# Patient Record
Sex: Female | Born: 1955 | Race: White | Hispanic: No | Smoking: Never smoker
Health system: Southern US, Community
[De-identification: ages and names within clinical notes are randomized; demographics above are authoritative.]

## PROBLEM LIST (undated history)

## (undated) DIAGNOSIS — S129XXA Fracture of neck, unspecified, initial encounter: Secondary | ICD-10-CM

## (undated) DIAGNOSIS — R42 Dizziness and giddiness: Secondary | ICD-10-CM

## (undated) DIAGNOSIS — S42309A Unspecified fracture of shaft of humerus, unspecified arm, initial encounter for closed fracture: Secondary | ICD-10-CM

## (undated) DIAGNOSIS — K649 Unspecified hemorrhoids: Secondary | ICD-10-CM

## (undated) DIAGNOSIS — S27309A Unspecified injury of lung, unspecified, initial encounter: Secondary | ICD-10-CM

## (undated) DIAGNOSIS — IMO0002 Reserved for concepts with insufficient information to code with codable children: Secondary | ICD-10-CM

## (undated) DIAGNOSIS — B029 Zoster without complications: Secondary | ICD-10-CM

## (undated) DIAGNOSIS — Z978 Presence of other specified devices: Secondary | ICD-10-CM

## (undated) HISTORY — PX: TUBAL LIGATION: SHX77

## (undated) HISTORY — PX: FRACTURE SURGERY: SHX138

## (undated) HISTORY — PX: COLONOSCOPY: SHX174

## (undated) HISTORY — PX: KNEE ARTHROSCOPY: SUR90

## (undated) HISTORY — PX: TONSILLECTOMY: SUR1361

---

## 2009-07-29 DIAGNOSIS — IMO0002 Reserved for concepts with insufficient information to code with codable children: Secondary | ICD-10-CM

## 2009-07-29 DIAGNOSIS — Z978 Presence of other specified devices: Secondary | ICD-10-CM

## 2009-07-29 DIAGNOSIS — S129XXA Fracture of neck, unspecified, initial encounter: Secondary | ICD-10-CM

## 2009-07-29 DIAGNOSIS — S27309A Unspecified injury of lung, unspecified, initial encounter: Secondary | ICD-10-CM

## 2009-07-29 DIAGNOSIS — S42309A Unspecified fracture of shaft of humerus, unspecified arm, initial encounter for closed fracture: Secondary | ICD-10-CM

## 2009-07-29 HISTORY — DX: Unspecified injury of lung, unspecified, initial encounter: S27.309A

## 2009-07-29 HISTORY — DX: Reserved for concepts with insufficient information to code with codable children: IMO0002

## 2009-07-29 HISTORY — PX: FRACTURE SURGERY: SHX138

## 2009-07-29 HISTORY — DX: Unspecified fracture of shaft of humerus, unspecified arm, initial encounter for closed fracture: S42.309A

## 2009-07-29 HISTORY — DX: Fracture of neck, unspecified, initial encounter: S12.9XXA

## 2009-07-29 HISTORY — DX: Presence of other specified devices: Z97.8

## 2013-03-16 ENCOUNTER — Other Ambulatory Visit (HOSPITAL_COMMUNITY): Payer: Self-pay | Admitting: Orthopedic Surgery

## 2013-03-18 ENCOUNTER — Encounter (HOSPITAL_COMMUNITY): Payer: Self-pay

## 2013-03-18 ENCOUNTER — Encounter (HOSPITAL_COMMUNITY): Payer: Self-pay | Admitting: Pharmacy Technician

## 2013-03-18 ENCOUNTER — Ambulatory Visit (HOSPITAL_COMMUNITY)
Admission: RE | Admit: 2013-03-18 | Discharge: 2013-03-18 | Disposition: A | Discharge status: Home / Self Care | Payer: BC Managed Care – PPO | Source: Ambulatory Visit | Attending: Orthopedic Surgery | Admitting: Orthopedic Surgery

## 2013-03-18 ENCOUNTER — Encounter (HOSPITAL_COMMUNITY)
Admission: RE | Admit: 2013-03-18 | Discharge: 2013-03-18 | Disposition: A | Discharge status: Home / Self Care | Payer: BC Managed Care – PPO | Source: Ambulatory Visit | Attending: Orthopedic Surgery | Admitting: Orthopedic Surgery

## 2013-03-18 ENCOUNTER — Ambulatory Visit (HOSPITAL_COMMUNITY): Admission: RE | Admit: 2013-03-18 | Payer: BC Managed Care – PPO | Source: Ambulatory Visit

## 2013-03-18 DIAGNOSIS — R091 Pleurisy: Secondary | ICD-10-CM | POA: Insufficient documentation

## 2013-03-18 DIAGNOSIS — Z01810 Encounter for preprocedural cardiovascular examination: Secondary | ICD-10-CM | POA: Insufficient documentation

## 2013-03-18 DIAGNOSIS — Z01818 Encounter for other preprocedural examination: Secondary | ICD-10-CM | POA: Insufficient documentation

## 2013-03-18 DIAGNOSIS — Z01812 Encounter for preprocedural laboratory examination: Secondary | ICD-10-CM | POA: Insufficient documentation

## 2013-03-18 HISTORY — DX: Fracture of neck, unspecified, initial encounter: S12.9XXA

## 2013-03-18 HISTORY — DX: Zoster without complications: B02.9

## 2013-03-18 HISTORY — DX: Unspecified hemorrhoids: K64.9

## 2013-03-18 HISTORY — DX: Unspecified injury of lung, unspecified, initial encounter: S27.309A

## 2013-03-18 HISTORY — DX: Reserved for concepts with insufficient information to code with codable children: IMO0002

## 2013-03-18 HISTORY — DX: Presence of other specified devices: Z97.8

## 2013-03-18 HISTORY — DX: Unspecified fracture of shaft of humerus, unspecified arm, initial encounter for closed fracture: S42.309A

## 2013-03-18 HISTORY — DX: Dizziness and giddiness: R42

## 2013-03-18 LAB — SEDIMENTATION RATE: Sed Rate: 0 mm/hr (ref 0–22)

## 2013-03-18 LAB — PROTIME-INR
INR: 0.97 (ref 0.00–1.49)
Prothrombin Time: 12.7 seconds (ref 11.6–15.2)

## 2013-03-18 LAB — URINALYSIS, ROUTINE W REFLEX MICROSCOPIC
Hgb urine dipstick: NEGATIVE
Nitrite: NEGATIVE
Specific Gravity, Urine: 1.005 (ref 1.005–1.030)
Urobilinogen, UA: 0.2 mg/dL (ref 0.0–1.0)

## 2013-03-18 LAB — COMPREHENSIVE METABOLIC PANEL
Albumin: 4.3 g/dL (ref 3.5–5.2)
Alkaline Phosphatase: 140 U/L — ABNORMAL HIGH (ref 39–117)
BUN: 11 mg/dL (ref 6–23)
Potassium: 3.9 mEq/L (ref 3.5–5.1)
Total Protein: 7.5 g/dL (ref 6.0–8.3)

## 2013-03-18 LAB — CBC
HCT: 42.5 % (ref 36.0–46.0)
MCHC: 35.5 g/dL (ref 30.0–36.0)
Platelets: 235 10*3/uL (ref 150–400)
RDW: 12.6 % (ref 11.5–15.5)

## 2013-03-18 LAB — APTT: aPTT: 30 seconds (ref 24–37)

## 2013-03-18 MED ORDER — CEFAZOLIN SODIUM-DEXTROSE 2-3 GM-% IV SOLR: 2.0000 g | INTRAVENOUS | Status: DC

## 2013-03-18 NOTE — Pre-Procedure Instructions (Signed)
Trenika Hudson  03/18/2013   Your procedure is scheduled on:  March 26, 2013 at 11:59 AM  Report to [facility] at 10:00 AM.  Call this number if you have problems the morning of surgery: [phone]   Remember:   Do not eat food or drink liquids after midnight.   Take these medicines the morning of surgery with A SIP OF WATER: acetaminophen (TYLENOL)   Stop all Vitamins, Fish oil, herbal medications, non-steroidals, Aspirin as of 03/19/2013    Do not wear jewelry, make-up or nail polish.  Do not wear lotions, powders, or perfumes. You may wear deodorant.  Do not shave 48 hours prior to surgery. Men may shave face and neck.  Do not bring valuables to the hospital.  [facility] is not responsible                   for any belongings or valuables.  Contacts, dentures or bridgework may not be worn into surgery.  Leave suitcase in the car. After surgery it may be brought to your room.  For patients admitted to the hospital, checkout time is 11:00 AM the day of  discharge.    Special Instructions: Shower using CHG 2 nights before surgery and the night before surgery.  If you shower the day of surgery use CHG.  Use special wash - you have one bottle of CHG for all showers.  You should use approximately 1/3 of the bottle for each shower.   Please read over the following fact sheets that you were given: Pain Booklet, Coughing and Deep Breathing and Surgical Site Infection Prevention

## 2013-03-19 LAB — C-REACTIVE PROTEIN: CRP: <0.5 mg/dL — ABNORMAL LOW (ref <0.60)

## 2013-03-26 ENCOUNTER — Inpatient Hospital Stay (HOSPITAL_COMMUNITY): Payer: BC Managed Care – PPO | Admitting: Anesthesiology

## 2013-03-26 ENCOUNTER — Encounter (HOSPITAL_COMMUNITY): Payer: Self-pay | Admitting: Anesthesiology

## 2013-03-26 ENCOUNTER — Observation Stay (HOSPITAL_COMMUNITY)
Admission: RE | Admit: 2013-03-26 | Discharge: 2013-03-27 | Disposition: A | Discharge status: Home / Self Care | Payer: BC Managed Care – PPO | Source: Ambulatory Visit | Attending: Orthopedic Surgery | Admitting: Orthopedic Surgery

## 2013-03-26 ENCOUNTER — Encounter (HOSPITAL_COMMUNITY): Payer: Self-pay | Admitting: *Deleted

## 2013-03-26 ENCOUNTER — Encounter (HOSPITAL_COMMUNITY)
Admission: RE | Disposition: A | Discharge status: Home / Self Care | Payer: Self-pay | Source: Ambulatory Visit | Attending: Orthopedic Surgery

## 2013-03-26 DIAGNOSIS — IMO0002 Reserved for concepts with insufficient information to code with codable children: Secondary | ICD-10-CM | POA: Diagnosis present

## 2013-03-26 DIAGNOSIS — S82202N Unspecified fracture of shaft of left tibia, subsequent encounter for open fracture type IIIA, IIIB, or IIIC with nonunion: Secondary | ICD-10-CM

## 2013-03-26 DIAGNOSIS — K649 Unspecified hemorrhoids: Secondary | ICD-10-CM | POA: Insufficient documentation

## 2013-03-26 HISTORY — PX: ORIF TIBIA FRACTURE: SHX5416

## 2013-03-26 LAB — GRAM STAIN: Gram Stain: NONE SEEN

## 2013-03-26 SURGERY — OPEN REDUCTION INTERNAL FIXATION (ORIF) TIBIA FRACTURE
Anesthesia: General | Site: Leg Lower | Laterality: Left | Wound class: Clean

## 2013-03-26 MED ORDER — DEXAMETHASONE SODIUM PHOSPHATE 4 MG/ML IJ SOLN
INTRAMUSCULAR | Status: DC | PRN
  Administered 2013-03-26: 4 mg via INTRAVENOUS

## 2013-03-26 MED ORDER — VANCOMYCIN HCL 1000 MG IV SOLR
INTRAVENOUS | Status: AC
  Filled 2013-03-26: qty 1000

## 2013-03-26 MED ORDER — PROPOFOL 10 MG/ML IV BOLUS
INTRAVENOUS | Status: DC | PRN
  Administered 2013-03-26: 100 mg via INTRAVENOUS

## 2013-03-26 MED ORDER — METOCLOPRAMIDE HCL 5 MG/ML IJ SOLN: 5.0000 mg | Freq: Three times a day (TID) | INTRAMUSCULAR | Status: DC | PRN

## 2013-03-26 MED ORDER — ONDANSETRON HCL 4 MG PO TABS: 4.0000 mg | ORAL_TABLET | Freq: Four times a day (QID) | ORAL | Status: DC | PRN

## 2013-03-26 MED ORDER — HYDROMORPHONE HCL PF 1 MG/ML IJ SOLN
0.2500 mg | INTRAMUSCULAR | Status: DC | PRN
  Administered 2013-03-26 (×4): 0.5 mg via INTRAVENOUS

## 2013-03-26 MED ORDER — SCOPOLAMINE 1 MG/3DAYS TD PT72
1.0000 | MEDICATED_PATCH | TRANSDERMAL | Status: AC
  Administered 2013-03-26: 1 via TRANSDERMAL

## 2013-03-26 MED ORDER — HYDROCODONE-ACETAMINOPHEN 5-325 MG PO TABS
1.0000 | ORAL_TABLET | ORAL | Status: DC | PRN
  Administered 2013-03-27 (×2): 2 via ORAL
  Filled 2013-03-26 (×2): qty 2

## 2013-03-26 MED ORDER — CEFAZOLIN SODIUM 1-5 GM-% IV SOLN
1.0000 g | Freq: Four times a day (QID) | INTRAVENOUS | Status: DC
  Administered 2013-03-26 – 2013-03-27 (×2): 1 g via INTRAVENOUS
  Filled 2013-03-26 (×4): qty 50

## 2013-03-26 MED ORDER — ARTIFICIAL TEARS OP OINT
TOPICAL_OINTMENT | OPHTHALMIC | Status: DC | PRN
  Administered 2013-03-26: 1 via OPHTHALMIC

## 2013-03-26 MED ORDER — MIDAZOLAM HCL 2 MG/2ML IJ SOLN: 0.5000 mg | Freq: Once | INTRAMUSCULAR | Status: DC | PRN

## 2013-03-26 MED ORDER — CEFAZOLIN SODIUM-DEXTROSE 2-3 GM-% IV SOLR: 2.0000 g | INTRAVENOUS | Status: DC

## 2013-03-26 MED ORDER — ONDANSETRON HCL 4 MG/2ML IJ SOLN
INTRAMUSCULAR | Status: DC | PRN
  Administered 2013-03-26: 4 mg via INTRAVENOUS

## 2013-03-26 MED ORDER — OXYCODONE HCL 5 MG PO TABS
5.0000 mg | ORAL_TABLET | Freq: Once | ORAL | Status: AC | PRN
  Administered 2013-03-26: 5 mg via ORAL

## 2013-03-26 MED ORDER — GENTAMICIN SULFATE 40 MG/ML IJ SOLN
INTRAMUSCULAR | Status: DC | PRN
  Administered 2013-03-26: 240 mg via INTRAMUSCULAR

## 2013-03-26 MED ORDER — HYDROMORPHONE HCL PF 1 MG/ML IJ SOLN
INTRAMUSCULAR | Status: AC
  Filled 2013-03-26: qty 1

## 2013-03-26 MED ORDER — FENTANYL CITRATE 0.05 MG/ML IJ SOLN
INTRAMUSCULAR | Status: DC | PRN
  Administered 2013-03-26: 50 ug via INTRAVENOUS
  Administered 2013-03-26: 200 ug via INTRAVENOUS
  Administered 2013-03-26: 50 ug via INTRAVENOUS

## 2013-03-26 MED ORDER — LACTATED RINGERS IV SOLN
INTRAVENOUS | Status: DC | PRN
  Administered 2013-03-26: 12:00:00 via INTRAVENOUS

## 2013-03-26 MED ORDER — OXYCODONE HCL 5 MG PO TABS
ORAL_TABLET | ORAL | Status: AC
  Filled 2013-03-26: qty 1

## 2013-03-26 MED ORDER — PROMETHAZINE HCL 25 MG/ML IJ SOLN: 6.2500 mg | INTRAMUSCULAR | Status: DC | PRN

## 2013-03-26 MED ORDER — OXYCODONE-ACETAMINOPHEN 5-325 MG PO TABS
1.0000 | ORAL_TABLET | ORAL | Status: DC | PRN
  Administered 2013-03-26 (×2): 2 via ORAL
  Filled 2013-03-26 (×2): qty 2

## 2013-03-26 MED ORDER — MEPERIDINE HCL 25 MG/ML IJ SOLN
6.2500 mg | INTRAMUSCULAR | Status: DC | PRN
  Administered 2013-03-26: 12.5 mg via INTRAVENOUS

## 2013-03-26 MED ORDER — ONDANSETRON HCL 4 MG/2ML IJ SOLN: 4.0000 mg | Freq: Four times a day (QID) | INTRAMUSCULAR | Status: DC | PRN

## 2013-03-26 MED ORDER — DOCUSATE SODIUM 100 MG PO CAPS
100.0000 mg | ORAL_CAPSULE | Freq: Two times a day (BID) | ORAL | Status: DC | PRN
  Administered 2013-03-26: 100 mg via ORAL
  Filled 2013-03-26: qty 1

## 2013-03-26 MED ORDER — METOCLOPRAMIDE HCL 10 MG PO TABS: 5.0000 mg | ORAL_TABLET | Freq: Three times a day (TID) | ORAL | Status: DC | PRN

## 2013-03-26 MED ORDER — VANCOMYCIN HCL 1000 MG IV SOLR
INTRAVENOUS | Status: DC | PRN
  Administered 2013-03-26: 1000 mg

## 2013-03-26 MED ORDER — MEPERIDINE HCL 25 MG/ML IJ SOLN
INTRAMUSCULAR | Status: AC
  Filled 2013-03-26: qty 1

## 2013-03-26 MED ORDER — 0.9 % SODIUM CHLORIDE (POUR BTL) OPTIME
TOPICAL | Status: DC | PRN
  Administered 2013-03-26: 1000 mL

## 2013-03-26 MED ORDER — SODIUM CHLORIDE 0.9 % IV SOLN
INTRAVENOUS | Status: DC
  Administered 2013-03-26: 15:00:00 via INTRAVENOUS

## 2013-03-26 MED ORDER — HYDROMORPHONE HCL PF 1 MG/ML IJ SOLN: 0.5000 mg | INTRAMUSCULAR | Status: DC | PRN

## 2013-03-26 MED ORDER — GENTAMICIN SULFATE 40 MG/ML IJ SOLN
INTRAMUSCULAR | Status: AC
  Filled 2013-03-26: qty 6

## 2013-03-26 MED ORDER — CEFAZOLIN SODIUM-DEXTROSE 2-3 GM-% IV SOLR
2.0000 g | Freq: Once | INTRAVENOUS | Status: DC
  Administered 2013-03-26: 2 g via INTRAVENOUS

## 2013-03-26 MED ORDER — LIDOCAINE HCL (CARDIAC) 20 MG/ML IV SOLN
INTRAVENOUS | Status: DC | PRN
  Administered 2013-03-26: 30 mg via INTRAVENOUS

## 2013-03-26 MED ORDER — MIDAZOLAM HCL 5 MG/5ML IJ SOLN
INTRAMUSCULAR | Status: DC | PRN
  Administered 2013-03-26 (×2): 1 mg via INTRAVENOUS

## 2013-03-26 MED ORDER — OXYCODONE-ACETAMINOPHEN 5-325 MG PO TABS: 1.0000 | ORAL_TABLET | ORAL | Status: DC | PRN

## 2013-03-26 MED ORDER — ASPIRIN EC 325 MG PO TBEC
325.0000 mg | DELAYED_RELEASE_TABLET | Freq: Every day | ORAL | Status: DC
  Administered 2013-03-27: 325 mg via ORAL
  Filled 2013-03-26: qty 1

## 2013-03-26 MED ORDER — OXYCODONE HCL 5 MG/5ML PO SOLN: 5.0000 mg | Freq: Once | ORAL | Status: AC | PRN

## 2013-03-26 SURGICAL SUPPLY — 63 items
BANDAGE ESMARK 6X9 LF (GAUZE/BANDAGES/DRESSINGS) IMPLANT
BANDAGE GAUZE ELAST BULKY 4 IN (GAUZE/BANDAGES/DRESSINGS) ×2 IMPLANT
BIT DRILL 2.5X2.75 QC CALB (BIT) ×2 IMPLANT
BIT DRILL CALIBRATED 2.7 (BIT) ×2 IMPLANT
BLADE SAW SGTL 18.5X63.X.64 HD (BLADE) ×2 IMPLANT
BLADE SURG 10 STRL SS (BLADE) ×2 IMPLANT
BNDG COHESIVE 4X5 TAN STRL (GAUZE/BANDAGES/DRESSINGS) ×2 IMPLANT
BNDG COHESIVE 6X5 TAN STRL LF (GAUZE/BANDAGES/DRESSINGS) ×2 IMPLANT
BNDG ESMARK 6X9 LF (GAUZE/BANDAGES/DRESSINGS)
BNDG GAUZE STRTCH 6 (GAUZE/BANDAGES/DRESSINGS) ×2 IMPLANT
CLOTH BEACON ORANGE TIMEOUT ST (SAFETY) ×2 IMPLANT
COVER SURGICAL LIGHT HANDLE (MISCELLANEOUS) ×2 IMPLANT
CUFF TOURNIQUET SINGLE 34IN LL (TOURNIQUET CUFF) IMPLANT
CUFF TOURNIQUET SINGLE 44IN (TOURNIQUET CUFF) IMPLANT
DRAPE C-ARM MINI 42X72 WSTRAPS (DRAPES) ×2 IMPLANT
DRAPE INCISE IOBAN 66X45 STRL (DRAPES) ×2 IMPLANT
DRAPE OEC MINIVIEW 54X84 (DRAPES) ×2 IMPLANT
DRAPE PROXIMA HALF (DRAPES) ×2 IMPLANT
DRAPE U-SHAPE 47X51 STRL (DRAPES) ×2 IMPLANT
DRSG ADAPTIC 3X8 NADH LF (GAUZE/BANDAGES/DRESSINGS) ×2 IMPLANT
DRSG PAD ABDOMINAL 8X10 ST (GAUZE/BANDAGES/DRESSINGS) ×2 IMPLANT
DURAPREP 26ML APPLICATOR (WOUND CARE) ×2 IMPLANT
ELECT REM PT RETURN 9FT ADLT (ELECTROSURGICAL) ×2
ELECTRODE REM PT RTRN 9FT ADLT (ELECTROSURGICAL) ×1 IMPLANT
GLOVE BIOGEL PI IND STRL 9 (GLOVE) ×1 IMPLANT
GLOVE BIOGEL PI INDICATOR 9 (GLOVE) ×1
GLOVE SURG ORTHO 9.0 STRL STRW (GLOVE) ×2 IMPLANT
GOWN PREVENTION PLUS XLARGE (GOWN DISPOSABLE) ×2 IMPLANT
GOWN SRG XL XLNG 56XLVL 4 (GOWN DISPOSABLE) ×2 IMPLANT
GOWN STRL NON-REIN XL XLG LVL4 (GOWN DISPOSABLE) ×2
K-WIRE ACE 1.6X6 (WIRE) ×2
KIT BASIN OR (CUSTOM PROCEDURE TRAY) ×2 IMPLANT
KIT ROOM TURNOVER OR (KITS) ×2 IMPLANT
KIT STIMULAN RAPID CURE  10CC (Orthopedic Implant) ×1 IMPLANT
KIT STIMULAN RAPID CURE 10CC (Orthopedic Implant) ×1 IMPLANT
KWIRE ACE 1.6X6 (WIRE) ×1 IMPLANT
MANIFOLD NEPTUNE II (INSTRUMENTS) ×2 IMPLANT
NS IRRIG 1000ML POUR BTL (IV SOLUTION) ×2 IMPLANT
PACK ORTHO EXTREMITY (CUSTOM PROCEDURE TRAY) ×2 IMPLANT
PAD ARMBOARD 7.5X6 YLW CONV (MISCELLANEOUS) ×4 IMPLANT
PADDING CAST COTTON 6X4 STRL (CAST SUPPLIES) ×2 IMPLANT
PLATE 9H LT DIST ANTLAT TIB (Plate) ×1 IMPLANT
PLATE ANTLAT CNTR 156X9 (Plate) ×1 IMPLANT
SCREW CORTICAL 3.5MM  20MM (Screw) ×1 IMPLANT
SCREW CORTICAL 3.5MM  28MM (Screw) ×1 IMPLANT
SCREW CORTICAL 3.5MM 20MM (Screw) ×1 IMPLANT
SCREW CORTICAL 3.5MM 24MM (Screw) ×8 IMPLANT
SCREW CORTICAL 3.5MM 28MM (Screw) ×1 IMPLANT
SCREW LOCK CORT STAR 3.5X32 (Screw) ×2 IMPLANT
SCREW LOCK CORT STAR 3.5X36 (Screw) ×2 IMPLANT
SCREW LOCK CORT STAR 3.5X38 (Screw) ×2 IMPLANT
SPONGE GAUZE 4X4 12PLY (GAUZE/BANDAGES/DRESSINGS) ×2 IMPLANT
SPONGE LAP 18X18 X RAY DECT (DISPOSABLE) ×2 IMPLANT
STAPLER VISISTAT 35W (STAPLE) IMPLANT
SUCTION FRAZIER TIP 10 FR DISP (SUCTIONS) ×2 IMPLANT
SUT ETHILON 2 0 PSLX (SUTURE) ×4 IMPLANT
SUT VIC AB 2-0 CTB1 (SUTURE) ×4 IMPLANT
SWAB COLLECTION DEVICE MRSA (MISCELLANEOUS) ×2 IMPLANT
TOWEL OR 17X24 6PK STRL BLUE (TOWEL DISPOSABLE) ×2 IMPLANT
TOWEL OR 17X26 10 PK STRL BLUE (TOWEL DISPOSABLE) ×2 IMPLANT
TUBE ANAEROBIC SPECIMEN COL (MISCELLANEOUS) ×2 IMPLANT
TUBE CONNECTING 12X1/4 (SUCTIONS) ×2 IMPLANT
WATER STERILE IRR 1000ML POUR (IV SOLUTION) IMPLANT

## 2013-03-26 NOTE — Anesthesia Postprocedure Evaluation (Signed)
  Anesthesia Post-op Note  Patient: Courtney Vance  Procedure(s) Performed: Procedure(s) with comments: OPEN REDUCTION INTERNAL FIXATION (ORIF) TIBIA FRACTURE- left (Left) - Open Reduction Internal Fixation Left Tibia, Antibiotic Beads  Patient Location: PACU  Anesthesia Type:General  Level of Consciousness: awake and alert   Airway and Oxygen Therapy: Patient Spontanous Breathing  Post-op Pain: mild  Post-op Assessment: Post-op Vital signs reviewed, Patient's Cardiovascular Status Stable, Respiratory Function Stable, Patent Airway, No signs of Nausea or vomiting and Pain level controlled  Post-op Vital Signs: stable  Complications: No apparent anesthesia complications

## 2013-03-26 NOTE — Discharge Instructions (Signed)
Nonweightbearing left foot. Where the fracture boot for ambulation. Work on range of motion of the ankle. Keep dressing clean dry and intact until followup.

## 2013-03-26 NOTE — Op Note (Signed)
OPERATIVE REPORT  DATE OF SURGERY: 03/26/2013  PATIENT:  Courtney Vance,  57 y.o. female  PRE-OPERATIVE DIAGNOSIS:  Non-Union Left Distal Tibia  POST-OPERATIVE DIAGNOSIS:  Non-Union Left Distal Tibia  PROCEDURE:  Procedure(s): OPEN REDUCTION INTERNAL FIXATION (ORIF) TIBIA FRACTURE- left Excision muscle soft tissue and bone. Placement of antibiotic beads with 10 cc stimulant beads with 1 g vancomycin and 240 mg gentamicin  SURGEON:  Surgeon(s): Nadara Mustard, MD  ANESTHESIA:   general  EBL:  Minimal ML  SPECIMEN:  Source of Specimen:  Cultures obtained x2000 from the nonunion site sent for quantitative cultures.  TOURNIQUET:  * No tourniquets in log *  PROCEDURE DETAILS: Patient is a 57 year old woman who is 3 years status post open type IIIa shaft tibia and fibular fracture on the left. Patient is undergone multiple surgical interventions in both floor to in [place] she has persistent nonunion of the tibia and presents at this time for surgical intervention. Risks and benefits were discussed including potential for deep infection potential for a two-stage operation risk of recurrent infection neurovascular injury nonhealing of the bone nonhealing of the skin need for additional surgery. Patient states she understands and wished to proceed at this time. Description of procedure patient brought to the operating room and underwent a general anesthetic. Patient did not want a regional block. After adequate levels and anesthesia obtained patient's left lower extremity was prepped using DuraPrep and draped into a sterile field. A extensile incision was made anterior laterally. This was carried bluntly down to bone and the compartments were elevated off the bone. Patient a large fibrous nonunion. Using a reciprocating saw osteotomes and curettes the nonviable bone was excised the fibrous bone was excised this was debrided back to healthy bleeding bone. The wound was irrigated with pulsatile  lavage throughout the case. The fracture was reduced and stabilized with a locking anterior lateral plate locking distally and compression screws proximally. C-arm fluoroscopy verified reduction. The incision was closed using 2-0 nylon. The wound was covered with Adaptic orthopedic sponges AB dressing Kerlix and Coban. Patient was extubated taken to the PACU in stable condition.  PLAN OF CARE: Admit to inpatient   PATIENT DISPOSITION:  PACU - hemodynamically stable.   Nadara Mustard, MD 03/26/2013 2:08 PM
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
## 2013-03-26 NOTE — Anesthesia Procedure Notes (Signed)
Procedure Name: LMA Insertion Date/Time: 03/26/2013 12:32 PM Performed by: Gayla Medicus Pre-anesthesia Checklist: Patient identified, Timeout performed, Emergency Drugs available, Suction available and Patient being monitored Patient Re-evaluated:Patient Re-evaluated prior to inductionOxygen Delivery Method: Circle system utilized Preoxygenation: Pre-oxygenation with 100% oxygen Intubation Type: IV induction LMA: LMA inserted LMA Size: 4.0 Number of attempts: 1 Placement Confirmation: positive ETCO2 and breath sounds checked- equal and bilateral Tube secured with: Tape Dental Injury: Teeth and Oropharynx as per pre-operative assessment

## 2013-03-26 NOTE — Progress Notes (Signed)
Patient ID: Courtney Vance, female   DOB: [birth year], 58 y.o.   MRN: [id number] Postoperative check status post open reduction internal fixation nonunion left tib-fib fracture 3 years out from her initial surgery. Patient is comfortable she has good range of motion of the foot and ankle. Her rotation of the ankle has been reestablished.  Patient may be discharged to home this weekend nonweightbearing on the left per prescription for Percocet on the chart I will followup with her in 2 weeks. She does have a followup appointment.

## 2013-03-26 NOTE — Preoperative (Signed)
Beta Blockers   Reason not to administer Beta Blockers:Not Applicable 

## 2013-03-26 NOTE — Anesthesia Preprocedure Evaluation (Addendum)
Anesthesia Evaluation  Patient identified by MRN, date of birth, ID band Patient awake    Reviewed: Allergy & Precautions, H&P , NPO status , Patient's Chart, lab work & pertinent test results  History of Anesthesia Complications Negative for: history of anesthetic complications  Airway Mallampati: II TM Distance: >3 FB Neck ROM: Full    Dental  (+) Teeth Intact and Dental Advisory Given   Pulmonary neg pulmonary ROS,  breath sounds clear to auscultation  Pulmonary exam normal       Cardiovascular negative cardio ROS  Rhythm:Regular Rate:Normal     Neuro/Psych vertigo negative psych ROS   GI/Hepatic negative GI ROS, Neg liver ROS,   Endo/Other  negative endocrine ROS  Renal/GU negative Renal ROS     Musculoskeletal   Abdominal   Peds  Hematology negative hematology ROS (+)   Anesthesia Other Findings   Reproductive/Obstetrics                          Anesthesia Physical Anesthesia Plan  ASA: I  Anesthesia Plan: General   Post-op Pain Management:    Induction: Intravenous  Airway Management Planned: LMA  Additional Equipment:   Intra-op Plan:   Post-operative Plan:   Informed Consent: I have reviewed the patients History and Physical, chart, labs and discussed the procedure including the risks, benefits and alternatives for the proposed anesthesia with the patient or authorized representative who has indicated his/her understanding and acceptance.   Dental advisory given  Plan Discussed with: CRNA, Anesthesiologist and Surgeon  Anesthesia Plan Comments: (Plan routine monitors, GA- LMA OK Patient declines popliteal block for analgesia)       Anesthesia Quick Evaluation

## 2013-03-26 NOTE — Transfer of Care (Signed)
Immediate Anesthesia Transfer of Care Note  Patient: Courtney Vance  Procedure(s) Performed: Procedure(s) with comments: OPEN REDUCTION INTERNAL FIXATION (ORIF) TIBIA FRACTURE- left (Left) - Open Reduction Internal Fixation Left Tibia, Antibiotic Beads  Patient Location: PACU  Anesthesia Type:General  Level of Consciousness: awake, alert  and oriented  Airway & Oxygen Therapy: Patient Spontanous Breathing and Patient connected to nasal cannula oxygen  Post-op Assessment: Report given to PACU RN, Post -op Vital signs reviewed and stable and Patient moving all extremities X 4  Post vital signs: Reviewed and stable  Complications: No apparent anesthesia complications

## 2013-03-26 NOTE — Progress Notes (Signed)
Orthopedic Tech Progress Note Patient Details:  Courtney Vance January 20, 1956 [id number]  Ortho Devices Type of Ortho Device: Postop shoe/boot Ortho Device/Splint Location: LLE Ortho Device/Splint Interventions: Ordered;Application   Jennye Moccasin 03/26/2013, 4:32 PM

## 2013-03-27 DIAGNOSIS — IMO0002 Reserved for concepts with insufficient information to code with codable children: Secondary | ICD-10-CM | POA: Diagnosis not present

## 2013-03-27 MED ORDER — OXYCODONE-ACETAMINOPHEN 5-325 MG PO TABS: 1.0000 | ORAL_TABLET | ORAL | Status: DC | PRN
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
## 2013-03-27 NOTE — Progress Notes (Signed)
   CARE MANAGEMENT NOTE 03/27/2013  Patient:  Courtney Vance,Courtney Vance   Account Number:  [id number]  Date Initiated:  03/27/2013  Documentation initiated by:  [facility]  Subjective/Objective Assessment:   OPEN REDUCTION INTERNAL FIXATION (ORIF) TIBIA FRACTURE- left     Action/Plan:   no PT/OT recommended   Anticipated DC Date:  03/27/2013   Anticipated DC Plan:  HOME W HOME HEALTH SERVICES      DC Planning Services  CM consult      Choice offered to / List presented to:             Status of service:  Completed, signed off Medicare Important Message given?   (If response is "NO", the following Medicare IM given date fields will be blank) Date Medicare IM given:   Date Additional Medicare IM given:    Discharge Disposition:  HOME/SELF CARE  Per UR Regulation:    If discussed at Long Length of Stay Meetings, dates discussed:    Comments:

## 2013-03-27 NOTE — Progress Notes (Signed)
1240  Discharge instruction and prescription given to pt >Verbalized understanding Wheeled to lobby by NT  With spouse

## 2013-03-27 NOTE — Discharge Summary (Signed)
  Discharge to home in stable condition. 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 Surgical procedure open reduction internal fixation left tibia takedown nonunion.

## 2013-03-27 NOTE — Progress Notes (Signed)
UR Completed.  Stephnie Parlier Jane [phone] 03/27/2013  

## 2013-03-28 LAB — WOUND CULTURE

## 2013-03-30 ENCOUNTER — Encounter (HOSPITAL_COMMUNITY): Payer: Self-pay | Admitting: Orthopedic Surgery

## 2013-03-30 LAB — TISSUE CULTURE: Gram Stain: NONE SEEN

## 2013-03-31 LAB — ANAEROBIC CULTURE

## 2014-03-14 IMAGING — CR DG CHEST 2V
2 series · 2 of 2 positions shown · non-contrast
Comparison: None.

CLINICAL DATA: Preop lower leg surgery.

EXAM:
CHEST  2 VIEW

[w chest pa]
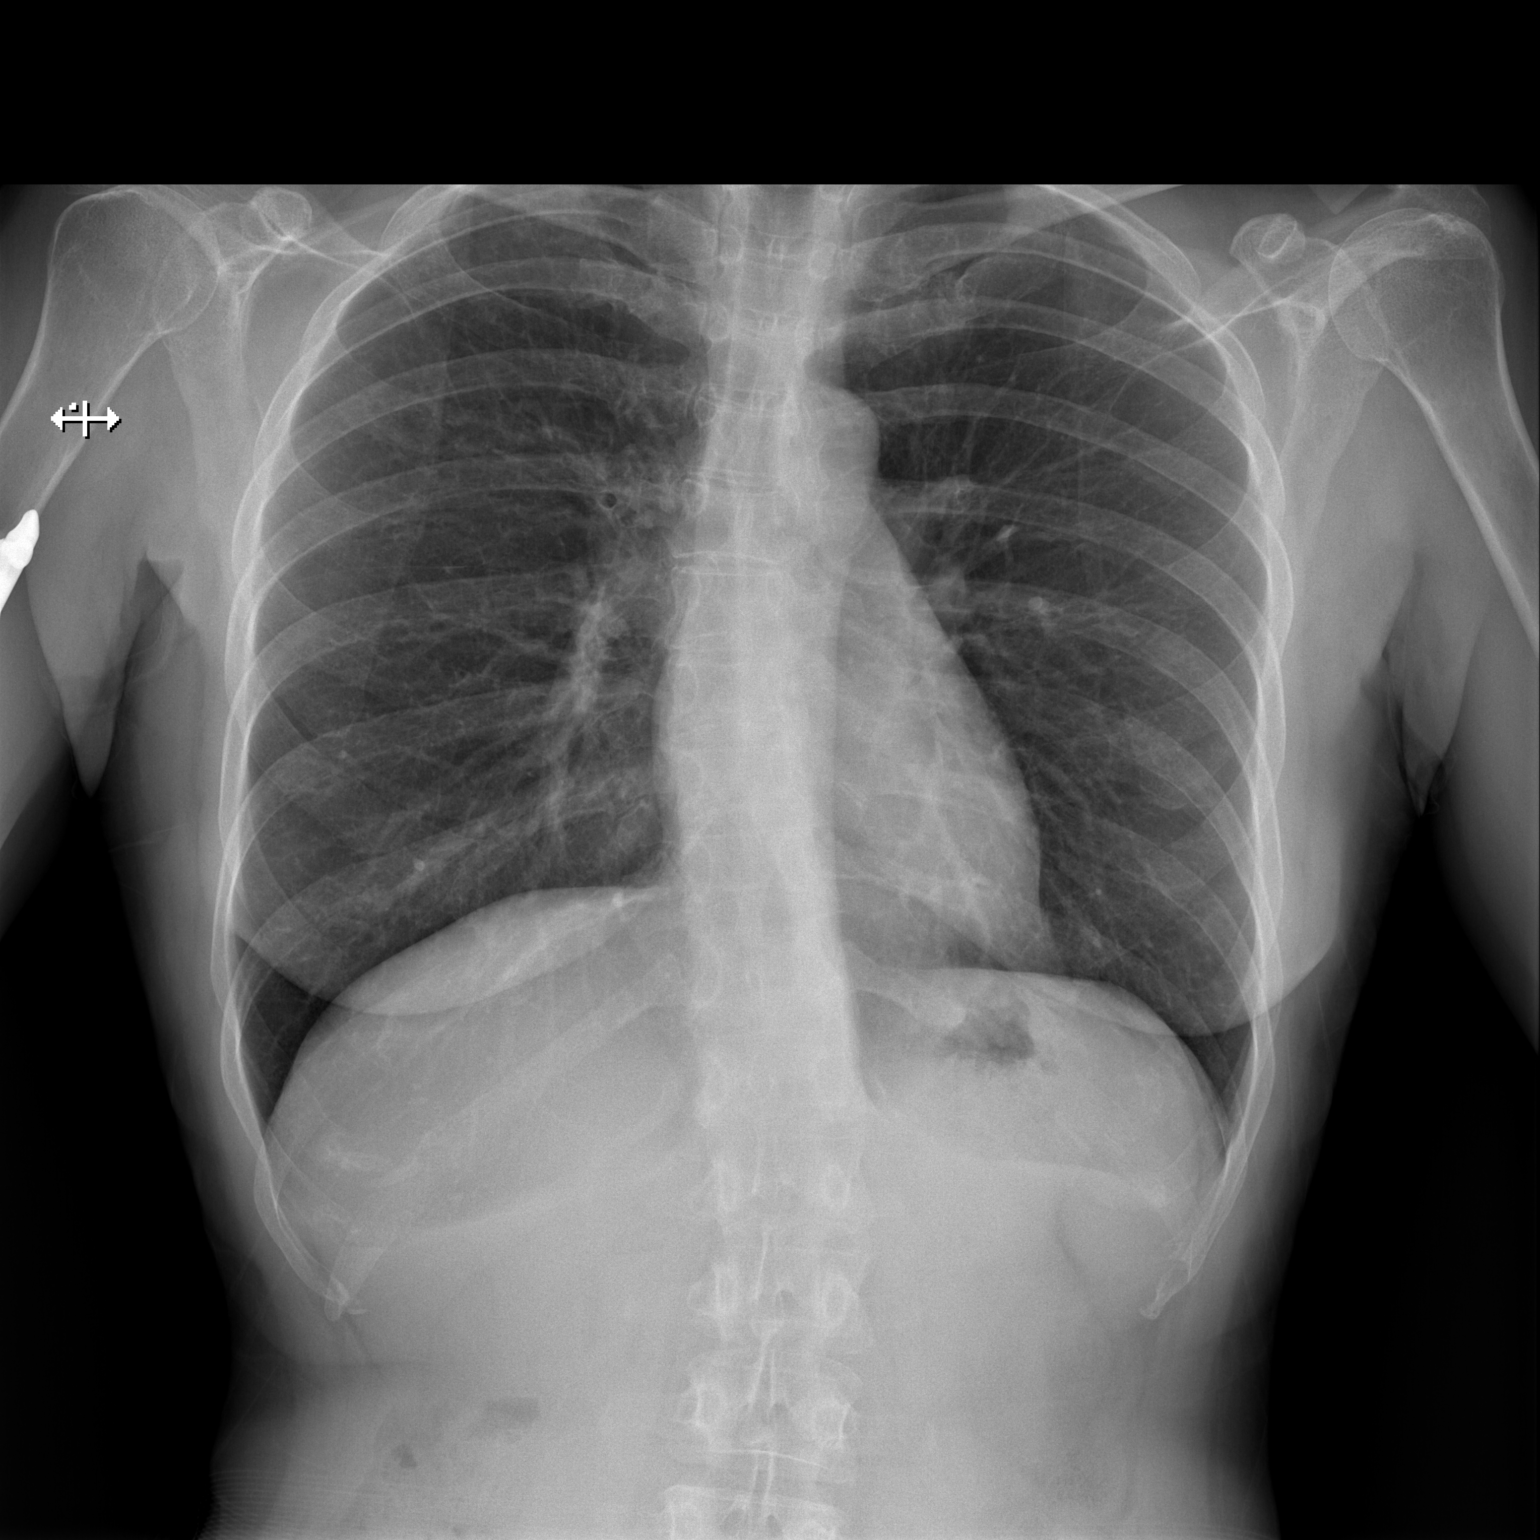

[w chest lat]
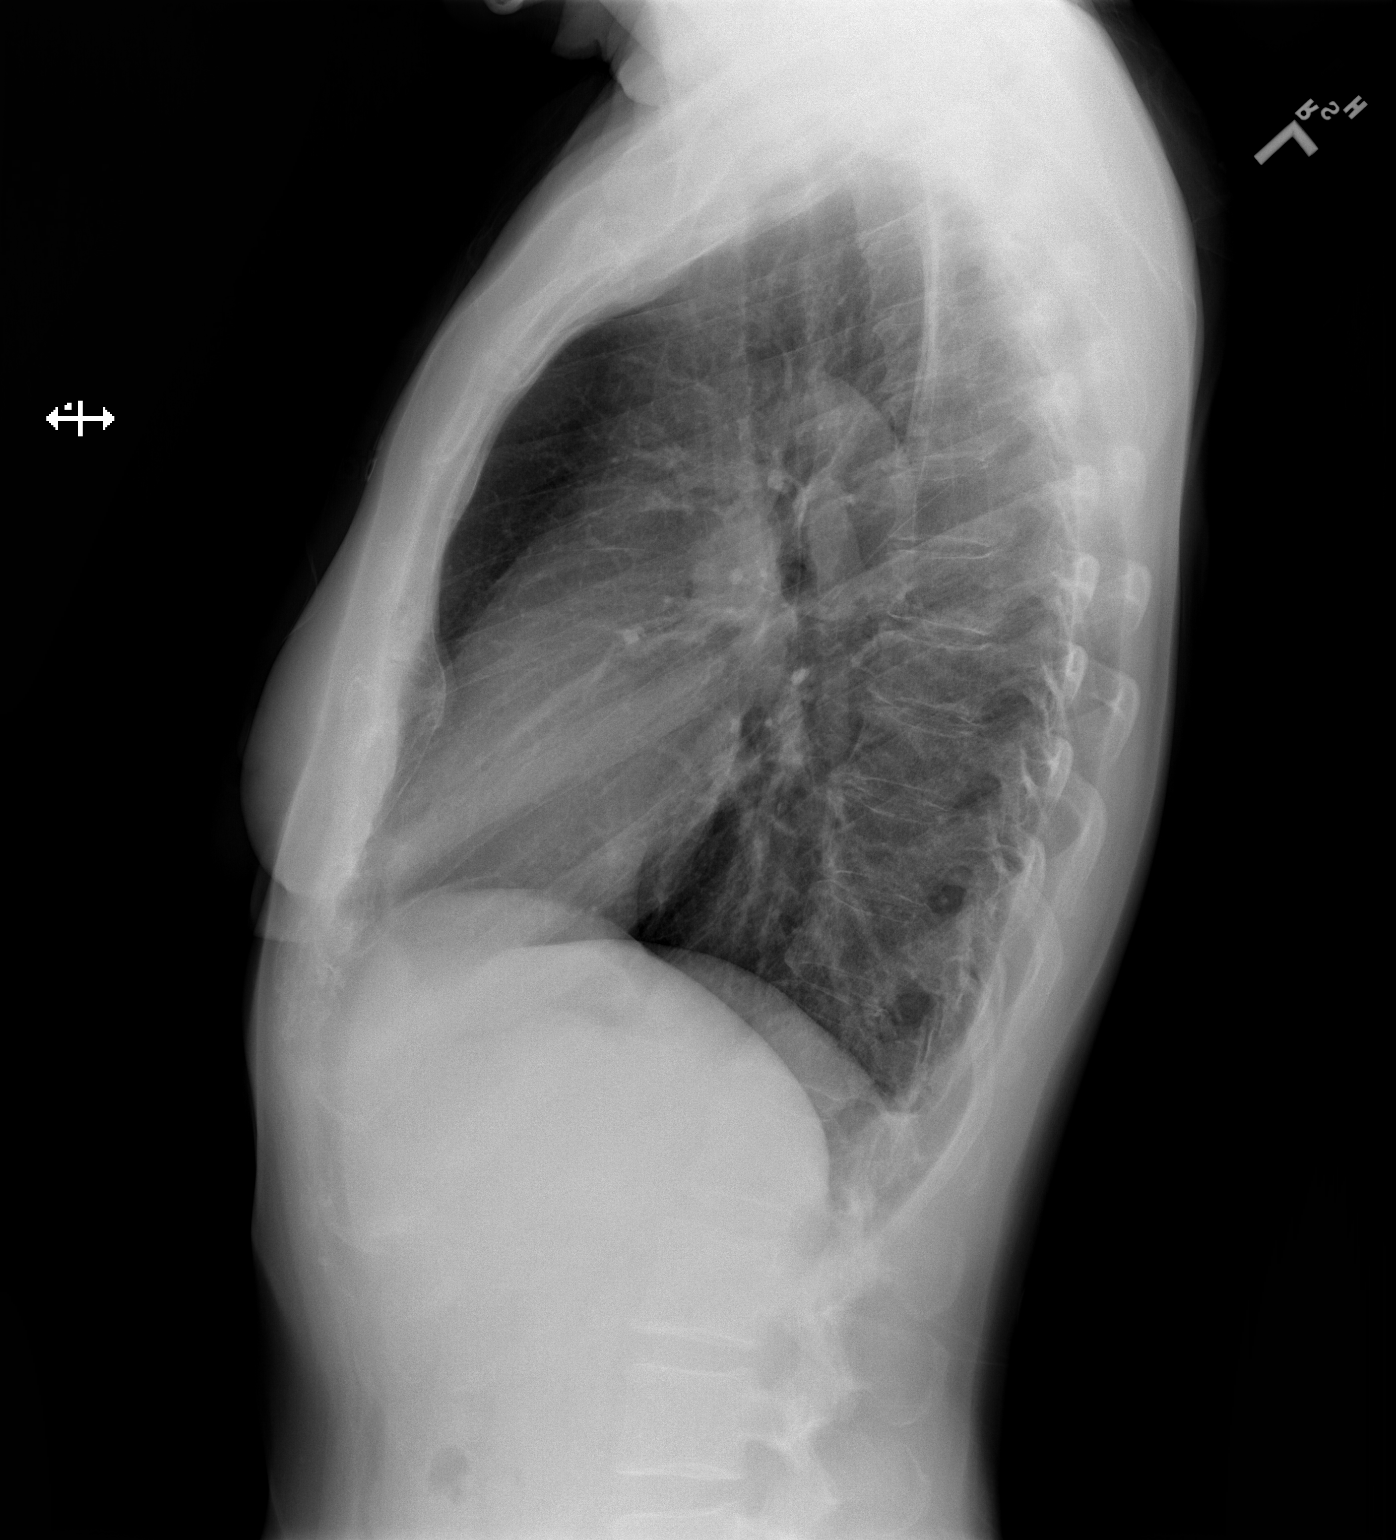

[2 of 2 positions shown; findings below may reference images not displayed]

FINDINGS: Mild peribronchial thickening. Heart and mediastinal contours are
within normal limits. No focal opacities or effusions. No acute bony
abnormality.
IMPRESSION: Mild bronchitic changes.

## 2023-02-18 ENCOUNTER — Other Ambulatory Visit (INDEPENDENT_AMBULATORY_CARE_PROVIDER_SITE_OTHER): Payer: Medicare Other

## 2023-02-18 ENCOUNTER — Ambulatory Visit (INDEPENDENT_AMBULATORY_CARE_PROVIDER_SITE_OTHER): Payer: Medicare Other | Admitting: Orthopedic Surgery

## 2023-02-18 DIAGNOSIS — M25561 Pain in right knee: Secondary | ICD-10-CM

## 2023-02-19 ENCOUNTER — Encounter: Payer: Self-pay | Admitting: Orthopedic Surgery

## 2023-02-19 DIAGNOSIS — M25561 Pain in right knee: Secondary | ICD-10-CM

## 2023-02-19 MED ORDER — METHYLPREDNISOLONE ACETATE 40 MG/ML IJ SUSP
40.0000 mg | INTRAMUSCULAR | Status: AC | PRN
  Administered 2023-02-19: 40 mg via INTRA_ARTICULAR

## 2023-02-19 MED ORDER — LIDOCAINE HCL (PF) 1 % IJ SOLN
5.0000 mL | INTRAMUSCULAR | Status: AC | PRN
  Administered 2023-02-19: 5 mL
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
## 2023-02-27 ENCOUNTER — Telehealth: Payer: Self-pay | Admitting: Orthopedic Surgery

## 2023-02-27 NOTE — Telephone Encounter (Signed)
Patient called advised the injection in her right knee did not work. Patient asked if she can get set up for an MRI? The number to contact patient is [phone]

## 2023-02-27 NOTE — Telephone Encounter (Signed)
Pt was in the office last Tuesday and states that cortisone injection in knee dis not help and wants to proceed with MRI. Ok to order?

## 2023-02-28 ENCOUNTER — Other Ambulatory Visit: Payer: Self-pay

## 2023-02-28 DIAGNOSIS — M25561 Pain in right knee: Secondary | ICD-10-CM

## 2023-02-28 NOTE — Telephone Encounter (Signed)
I called pt and lm on vm to advise order for MRI in chart. We will call to get auth from insurance and then call to sch once this has been obtained. Can take 5-7 business days to get auth  and to call with any questions.

## 2023-03-10 ENCOUNTER — Encounter: Payer: Self-pay | Admitting: Orthopedic Surgery

## 2023-03-16 ENCOUNTER — Ambulatory Visit
Admission: RE | Admit: 2023-03-16 | Discharge: 2023-03-16 | Disposition: A | Discharge status: Home / Self Care | Payer: Medicare Other | Source: Ambulatory Visit | Attending: Orthopedic Surgery | Admitting: Orthopedic Surgery

## 2023-03-16 DIAGNOSIS — M25561 Pain in right knee: Secondary | ICD-10-CM

## 2023-03-18 ENCOUNTER — Encounter: Payer: Self-pay | Admitting: Orthopedic Surgery

## 2023-03-19 ENCOUNTER — Telehealth: Payer: Self-pay

## 2023-03-19 NOTE — Telephone Encounter (Signed)
Pt would like to proceed with surgery if you will please complete as blue sheet. Courtney Vance she would like to sch in October. I advised that you would call her in a few weeks to set this up.

## 2023-05-07 ENCOUNTER — Encounter (HOSPITAL_COMMUNITY): Payer: Self-pay | Admitting: Orthopedic Surgery

## 2023-05-07 ENCOUNTER — Other Ambulatory Visit: Payer: Self-pay

## 2023-05-07 NOTE — Progress Notes (Signed)
PCP - Quitman Livings Cardiologist -   PPM/ICD - denies Device Orders - n/a Rep Notified - n/a  Chest x-ray - denies  EKG - denies Stress Test - denies ECHO - denies Cardiac Cath - denies  CPAP - denies  DM denies  Blood Thinner Instructions: denies Aspirin Instructions: n/a  ERAS Protcol - Clear liquids until 5;30 am  COVID TEST- na  Anesthesia review: no  Patient verbally denies any shortness of breath, fever, cough and chest pain during phone call   -------------  SDW INSTRUCTIONS given:  Your procedure is scheduled on May 09, 2023.  Report to [facility] Main Entrance "A" at 6:00 A.M., and check in at the Admitting office.  Call this number if you have problems the morning of surgery:  [phone]   Remember:  Do not eat after midnight the night before your surgery  You may drink clear liquids until 5:30 the morning of your surgery.   Clear liquids allowed are: Water, Non-Citrus Juices (without pulp), Carbonated Beverages, Clear Tea, Black Coffee Only, and Gatorade    Take these medicines the morning of surgery with A SIP OF WATER   IF NEEDED acetaminophen (TYLENOL)   As of today, STOP taking any Aspirin (unless otherwise instructed by your surgeon) Aleve, Naproxen, Ibuprofen, Motrin, Advil, Goody's, BC's, all herbal medications, fish oil, and all vitamins.                      Do not wear jewelry, make up, or nail polish            Do not wear lotions, powders, perfumes/colognes, or deodorant.            Do not shave 48 hours prior to surgery.  Men may shave face and neck.            Do not bring valuables to the hospital.            [facility] is not responsible for any belongings or valuables.  Do NOT Smoke (Tobacco/Vaping) 24 hours prior to your procedure If you use a CPAP at night, you may bring all equipment for your overnight stay.   Contacts, glasses, dentures or bridgework may not be worn into surgery.      For patients admitted to the  hospital, discharge time will be determined by your treatment team.   Patients discharged the day of surgery will not be allowed to drive home, and someone needs to stay with them for 24 hours.    Special instructions:   [facility]- Preparing For Surgery  Before surgery, you can play an important role. Because skin is not sterile, your skin needs to be as free of germs as possible. You can reduce the number of germs on your skin by washing with CHG (chlorahexidine gluconate) Soap before surgery.  CHG is an antiseptic cleaner which kills germs and bonds with the skin to continue killing germs even after washing.    Oral Hygiene is also important to reduce your risk of infection.  Remember - BRUSH YOUR TEETH THE MORNING OF SURGERY WITH YOUR REGULAR TOOTHPASTE  Please do not use if you have an allergy to CHG or antibacterial soaps. If your skin becomes reddened/irritated stop using the CHG.  Do not shave (including legs and underarms) for at least 48 hours prior to first CHG shower. It is OK to shave your face.  Please follow these instructions carefully.   Shower the [employer] BEFORE SURGERY  and the MORNING OF SURGERY with DIAL Soap.   Pat yourself dry with a CLEAN TOWEL.  Wear CLEAN PAJAMAS to bed the night before surgery  Place CLEAN SHEETS on your bed the night of your first shower and DO NOT SLEEP WITH PETS.   Day of Surgery: Please shower morning of surgery  Wear Clean/Comfortable clothing the morning of surgery Do not apply any deodorants/lotions.   Remember to brush your teeth WITH YOUR REGULAR TOOTHPASTE.   Questions were answered. Patient verbalized understanding of instructions.

## 2023-05-08 NOTE — Anesthesia Preprocedure Evaluation (Addendum)
Anesthesia Evaluation  Patient identified by MRN, date of birth, ID band Patient awake    Reviewed: Allergy & Precautions, H&P , NPO status , Patient's Chart, lab work & pertinent test results  History of Anesthesia Complications Negative for: history of anesthetic complications  Airway Mallampati: II  TM Distance: >3 FB Neck ROM: Full    Dental  (+) Teeth Intact, Dental Advisory Given   Pulmonary neg pulmonary ROS   Pulmonary exam normal breath sounds clear to auscultation       Cardiovascular Exercise Tolerance: Good negative cardio ROS Normal cardiovascular exam Rhythm:Regular Rate:Normal     Neuro/Psych vertigo negative neurological ROS  negative psych ROS   GI/Hepatic negative GI ROS, Neg liver ROS,,,  Endo/Other  negative endocrine ROS    Renal/GU negative Renal ROS  negative genitourinary   Musculoskeletal   Abdominal   Peds  Hematology negative hematology ROS (+)   Anesthesia Other Findings   Reproductive/Obstetrics negative OB ROS                             Anesthesia Physical Anesthesia Plan  ASA: 3  Anesthesia Plan: General   Post-op Pain Management: Minimal or no pain anticipated, Tylenol PO (pre-op)* and Celebrex PO (pre-op)*   Induction: Intravenous  PONV Risk Score and Plan: 3 and Ondansetron, Treatment may vary due to age or medical condition, Scopolamine patch - Pre-op and TIVA  Airway Management Planned: LMA  Additional Equipment: None  Intra-op Plan:   Post-operative Plan: Extubation in OR  Informed Consent: I have reviewed the patients History and Physical, chart, labs and discussed the procedure including the risks, benefits and alternatives for the proposed anesthesia with the patient or authorized representative who has indicated his/her understanding and acceptance.     Dental advisory given  Plan Discussed with: CRNA, Anesthesiologist and  Surgeon  Anesthesia Plan Comments:         Anesthesia Quick Evaluation

## 2023-05-09 ENCOUNTER — Encounter (HOSPITAL_COMMUNITY): Payer: Self-pay | Admitting: Orthopedic Surgery

## 2023-05-09 ENCOUNTER — Ambulatory Visit (HOSPITAL_COMMUNITY)
Admission: RE | Admit: 2023-05-09 | Discharge: 2023-05-09 | Disposition: A | Discharge status: Home / Self Care | Payer: Medicare Other | Attending: Orthopedic Surgery | Admitting: Orthopedic Surgery

## 2023-05-09 ENCOUNTER — Ambulatory Visit (HOSPITAL_COMMUNITY): Payer: Medicare Other | Admitting: Anesthesiology

## 2023-05-09 ENCOUNTER — Encounter (HOSPITAL_COMMUNITY)
Admission: RE | Disposition: A | Discharge status: Home / Self Care | Payer: Self-pay | Source: Home / Self Care | Attending: Orthopedic Surgery

## 2023-05-09 ENCOUNTER — Other Ambulatory Visit: Payer: Self-pay

## 2023-05-09 ENCOUNTER — Ambulatory Visit (HOSPITAL_BASED_OUTPATIENT_CLINIC_OR_DEPARTMENT_OTHER): Payer: Medicare Other | Admitting: Anesthesiology

## 2023-05-09 DIAGNOSIS — M1711 Unilateral primary osteoarthritis, right knee: Secondary | ICD-10-CM | POA: Diagnosis not present

## 2023-05-09 DIAGNOSIS — S82241A Displaced spiral fracture of shaft of right tibia, initial encounter for closed fracture: Secondary | ICD-10-CM

## 2023-05-09 DIAGNOSIS — M23203 Derangement of unspecified medial meniscus due to old tear or injury, right knee: Secondary | ICD-10-CM | POA: Diagnosis not present

## 2023-05-09 DIAGNOSIS — S83231A Complex tear of medial meniscus, current injury, right knee, initial encounter: Secondary | ICD-10-CM | POA: Insufficient documentation

## 2023-05-09 DIAGNOSIS — Y93A3 Activity, aerobic and step exercise: Secondary | ICD-10-CM | POA: Insufficient documentation

## 2023-05-09 DIAGNOSIS — X500XXA Overexertion from strenuous movement or load, initial encounter: Secondary | ICD-10-CM | POA: Insufficient documentation

## 2023-05-09 HISTORY — PX: KNEE ARTHROSCOPY: SHX127

## 2023-05-09 LAB — CBC
HCT: 43.4 % (ref 36.0–46.0)
Hemoglobin: 14.4 g/dL (ref 12.0–15.0)
MCH: 30.3 pg (ref 26.0–34.0)
MCHC: 33.2 g/dL (ref 30.0–36.0)
MCV: 91.2 fL (ref 80.0–100.0)
Platelets: 203 10*3/uL (ref 150–400)
RBC: 4.76 MIL/uL (ref 3.87–5.11)
RDW: 12.3 % (ref 11.5–15.5)
WBC: 5.9 10*3/uL (ref 4.0–10.5)
nRBC: 0 % (ref 0.0–0.2)

## 2023-05-09 SURGERY — ARTHROSCOPY, KNEE
Anesthesia: General | Site: Knee | Laterality: Right

## 2023-05-09 MED ORDER — PROPOFOL 10 MG/ML IV BOLUS
INTRAVENOUS | Status: AC
  Filled 2023-05-09: qty 20

## 2023-05-09 MED ORDER — HYDROCODONE-ACETAMINOPHEN 5-325 MG PO TABS
1.0000 | ORAL_TABLET | ORAL | 0 refills | Status: AC | PRN
Start: 2023-05-09 — End: ?

## 2023-05-09 MED ORDER — PROPOFOL 10 MG/ML IV BOLUS
INTRAVENOUS | Status: DC | PRN
  Administered 2023-05-09: 30 mg via INTRAVENOUS
  Administered 2023-05-09: 200 mg via INTRAVENOUS

## 2023-05-09 MED ORDER — MIDAZOLAM HCL 2 MG/2ML IJ SOLN
INTRAMUSCULAR | Status: AC
  Filled 2023-05-09: qty 2

## 2023-05-09 MED ORDER — CHLORHEXIDINE GLUCONATE 0.12 % MT SOLN
15.0000 mL | Freq: Once | OROMUCOSAL | Status: AC
  Administered 2023-05-09: 15 mL via OROMUCOSAL
  Filled 2023-05-09: qty 15

## 2023-05-09 MED ORDER — CEFAZOLIN SODIUM-DEXTROSE 2-4 GM/100ML-% IV SOLN
2.0000 g | INTRAVENOUS | Status: AC
  Administered 2023-05-09: 2 g via INTRAVENOUS
  Filled 2023-05-09: qty 100

## 2023-05-09 MED ORDER — LACTATED RINGERS IV SOLN: INTRAVENOUS | Status: DC

## 2023-05-09 MED ORDER — ACETAMINOPHEN 160 MG/5ML PO SOLN: 325.0000 mg | ORAL | Status: DC | PRN

## 2023-05-09 MED ORDER — ACETAMINOPHEN 325 MG PO TABS: 325.0000 mg | ORAL_TABLET | ORAL | Status: DC | PRN

## 2023-05-09 MED ORDER — MIDAZOLAM HCL 2 MG/2ML IJ SOLN
INTRAMUSCULAR | Status: DC | PRN
  Administered 2023-05-09: 2 mg via INTRAVENOUS

## 2023-05-09 MED ORDER — BUPIVACAINE HCL 0.25 % IJ SOLN
INTRAMUSCULAR | Status: DC | PRN
Start: 2023-05-09 — End: 2023-05-09
  Administered 2023-05-09: 30 mL

## 2023-05-09 MED ORDER — LIDOCAINE 2% (20 MG/ML) 5 ML SYRINGE
INTRAMUSCULAR | Status: DC | PRN
  Administered 2023-05-09: 5 mL via INTRAVENOUS

## 2023-05-09 MED ORDER — OXYCODONE HCL 5 MG/5ML PO SOLN: 5.0000 mg | Freq: Once | ORAL | Status: DC | PRN

## 2023-05-09 MED ORDER — SCOPOLAMINE 1 MG/3DAYS TD PT72
MEDICATED_PATCH | TRANSDERMAL | Status: AC
  Administered 2023-05-09: 1.5 mg via TRANSDERMAL
  Filled 2023-05-09: qty 1

## 2023-05-09 MED ORDER — SODIUM CHLORIDE 0.9 % IR SOLN
Status: DC | PRN
  Administered 2023-05-09: 3000 mL

## 2023-05-09 MED ORDER — PROPOFOL 500 MG/50ML IV EMUL
INTRAVENOUS | Status: DC | PRN
Start: 2023-05-09 — End: 2023-05-09
  Administered 2023-05-09: 100 ug/kg/min via INTRAVENOUS

## 2023-05-09 MED ORDER — SCOPOLAMINE 1 MG/3DAYS TD PT72: 1.0000 | MEDICATED_PATCH | TRANSDERMAL | Status: DC

## 2023-05-09 MED ORDER — FENTANYL CITRATE (PF) 100 MCG/2ML IJ SOLN: 25.0000 ug | INTRAMUSCULAR | Status: DC | PRN

## 2023-05-09 MED ORDER — ORAL CARE MOUTH RINSE: 15.0000 mL | Freq: Once | OROMUCOSAL | Status: AC

## 2023-05-09 MED ORDER — SCOPOLAMINE 1 MG/3DAYS TD PT72
1.0000 | MEDICATED_PATCH | TRANSDERMAL | Status: DC
  Filled 2023-05-09: qty 1

## 2023-05-09 MED ORDER — OXYCODONE HCL 5 MG PO TABS: 5.0000 mg | ORAL_TABLET | Freq: Once | ORAL | Status: DC | PRN

## 2023-05-09 MED ORDER — KETOROLAC TROMETHAMINE 30 MG/ML IJ SOLN
INTRAMUSCULAR | Status: DC | PRN
Start: 2023-05-09 — End: 2023-05-09
  Administered 2023-05-09: 30 mg via INTRAVENOUS

## 2023-05-09 MED ORDER — MEPERIDINE HCL 25 MG/ML IJ SOLN: 6.2500 mg | INTRAMUSCULAR | Status: DC | PRN

## 2023-05-09 MED ORDER — ONDANSETRON HCL 4 MG/2ML IJ SOLN
INTRAMUSCULAR | Status: DC | PRN
  Administered 2023-05-09: 4 mg via INTRAVENOUS

## 2023-05-09 MED ORDER — BUPIVACAINE HCL (PF) 0.25 % IJ SOLN
INTRAMUSCULAR | Status: AC
  Filled 2023-05-09: qty 30

## 2023-05-09 MED ORDER — ONDANSETRON HCL 4 MG/2ML IJ SOLN: 4.0000 mg | Freq: Once | INTRAMUSCULAR | Status: DC | PRN

## 2023-05-09 MED ORDER — DEXMEDETOMIDINE HCL IN NACL 80 MCG/20ML IV SOLN
INTRAVENOUS | Status: DC | PRN
Start: 2023-05-09 — End: 2023-05-09
  Administered 2023-05-09 (×2): 8 ug via INTRAVENOUS

## 2023-05-09 SURGICAL SUPPLY — 30 items
BLADE EXCALIBUR 4.0X13 (MISCELLANEOUS) IMPLANT
BNDG CMPR 5X6 CHSV STRCH STRL (GAUZE/BANDAGES/DRESSINGS) ×1
BNDG COHESIVE 6X5 TAN ST LF (GAUZE/BANDAGES/DRESSINGS) ×1 IMPLANT
BNDG GAUZE DERMACEA FLUFF 4 (GAUZE/BANDAGES/DRESSINGS) ×1 IMPLANT
BNDG GZE DERMACEA 4 6PLY (GAUZE/BANDAGES/DRESSINGS) ×2
COVER SURGICAL LIGHT HANDLE (MISCELLANEOUS) ×2 IMPLANT
CUFF TOURN SGL QUICK 34 (TOURNIQUET CUFF)
CUFF TOURN SGL QUICK 42 (TOURNIQUET CUFF) IMPLANT
CUFF TRNQT CYL 34X4.125X (TOURNIQUET CUFF) IMPLANT
DRAPE ARTHROSCOPY W/POUCH 114 (DRAPES) ×1 IMPLANT
DRAPE U-SHAPE 47X51 STRL (DRAPES) ×1 IMPLANT
DRSG EMULSION OIL 3X3 NADH (GAUZE/BANDAGES/DRESSINGS) ×1 IMPLANT
DURAPREP 26ML APPLICATOR (WOUND CARE) ×1 IMPLANT
GAUZE SPONGE 4X4 12PLY STRL (GAUZE/BANDAGES/DRESSINGS) ×1 IMPLANT
GLOVE BIOGEL PI IND STRL 9 (GLOVE) ×1 IMPLANT
GLOVE SURG ORTHO 9.0 STRL STRW (GLOVE) ×1 IMPLANT
GOWN STRL REUS W/ TWL XL LVL3 (GOWN DISPOSABLE) ×3 IMPLANT
GOWN STRL REUS W/TWL XL LVL3 (GOWN DISPOSABLE) ×3
KIT BASIN OR (CUSTOM PROCEDURE TRAY) ×1 IMPLANT
KIT TURNOVER KIT B (KITS) ×1 IMPLANT
MANIFOLD NEPTUNE II (INSTRUMENTS) ×1 IMPLANT
NDL 18GX1X1/2 (RX/OR ONLY) (NEEDLE) ×1 IMPLANT
NEEDLE 18GX1X1/2 (RX/OR ONLY) (NEEDLE) ×1 IMPLANT
PACK ARTHROSCOPY DSU (CUSTOM PROCEDURE TRAY) ×1 IMPLANT
PAD ARMBOARD 7.5X6 YLW CONV (MISCELLANEOUS) ×2 IMPLANT
PORT APPOLLO RF 90DEGREE MULTI (SURGICAL WAND) IMPLANT
SUT ETHILON 4 0 PS 2 18 (SUTURE) ×1 IMPLANT
TOWEL GREEN STERILE FF (TOWEL DISPOSABLE) ×2 IMPLANT
TUBE CONNECTING 12X1/4 (SUCTIONS) ×1 IMPLANT
TUBING ARTHROSCOPY IRRIG 16FT (MISCELLANEOUS) ×1 IMPLANT

## 2023-05-09 NOTE — Anesthesia Procedure Notes (Signed)
Procedure Name: LMA Insertion Date/Time: 05/09/2023 8:59 AM  Performed by: Caryn Bee A, CRNAPre-anesthesia Checklist: Patient identified, Emergency Drugs available, Suction available and Patient being monitored Patient Re-evaluated:Patient Re-evaluated prior to induction Oxygen Delivery Method: Circle System Utilized Preoxygenation: Pre-oxygenation with 100% oxygen Induction Type: IV induction Ventilation: Mask ventilation without difficulty LMA: LMA inserted LMA Size: 4.0 Number of attempts: 1 Airway Equipment and Method: Bite block Placement Confirmation: positive ETCO2 Tube secured with: Tape Dental Injury: Teeth and Oropharynx as per pre-operative assessment

## 2023-05-09 NOTE — Anesthesia Postprocedure Evaluation (Signed)
Anesthesia Post Note  Patient: Malayiah Mcbrayer  Procedure(s) Performed: RIGHT KNEE ARTHROSCOPY AND DEBRIDEMENT (Right: Knee)     Patient location during evaluation: PACU Anesthesia Type: General Level of consciousness: awake and alert Pain management: pain level controlled Vital Signs Assessment: post-procedure vital signs reviewed and stable Respiratory status: spontaneous breathing, nonlabored ventilation, respiratory function stable and patient connected to nasal cannula oxygen Cardiovascular status: blood pressure returned to baseline and stable Postop Assessment: no apparent nausea or vomiting Anesthetic complications: no   No notable events documented.  Last Vitals:  Vitals:   05/09/23 0947 05/09/23 1000  BP: 101/61 111/72  Pulse: 61 65  Resp: 16 16  Temp:  36.4 C  SpO2: 95% 100%    Last Pain:  Vitals:   05/09/23 0932  TempSrc:   PainSc: 2                  Matti Killingsworth

## 2023-05-09 NOTE — Op Note (Signed)
05/09/2023  9:39 AM  PATIENT:  Courtney Vance    PRE-OPERATIVE DIAGNOSIS:  Right Knee Medial Meniscus Tear  POST-OPERATIVE DIAGNOSIS:  Same  PROCEDURE:  RIGHT KNEE ARTHROSCOPY AND meniscectomy medially for complex meniscal tear  SURGEON:  Nadara Mustard, MD  PHYSICIAN ASSISTANT:None ANESTHESIA:   General  PREOPERATIVE INDICATIONS:  [name] is a  67 y.o. female with a diagnosis of Right Knee Medial Meniscus Tear who failed conservative measures and elected for surgical management.    The risks benefits and alternatives were discussed with the patient preoperatively including but not limited to the risks of infection, bleeding, nerve injury, cardiopulmonary complications, the need for revision surgery, among others, and the patient was willing to proceed.  OPERATIVE IMPLANTS:   * No implants in log *  @ENCIMAGES @  OPERATIVE FINDINGS: Patient had osteochondral changes of the medial femoral condyle and medial tibial plateau this was debrided back to stable margins.  Patient had a complex tear of the medial meniscus that was debrided.  The lateral joint line had intact meniscus intact cartilage.  Patellofemoral joint had intact cartilage.  There was inflamed plica that was resected.  OPERATIVE PROCEDURE: Patient brought the operating room underwent a general anesthetic.  After adequate levels anesthesia obtained patient's right lower extremity was prepped using DuraPrep draped into a sterile field a timeout was called.  The arthroscopy scope was inserted through the anterior lateral portal anterior medial working portal was established.  Visualization showed a complex tear of the medial meniscus this was debrided back with the shaver and the electrical wand was used to provide hemostasis.  Patient had degenerative changes of the medial femoral condyle medial tibial plateau and this was debrided with a shaver back to stable margins.  The ACL was intact.  Lateral joint line showed intact  cartilage intact lateral meniscus.  With the knee extended patient had intact cartilage of the patellofemoral joint there was inflammation and swelling over the plica and this was resected.  Survey of all compartments again showed there to be no loose bodies.  The instruments removed portals closed with 3-0 nylon and the knee was injected with a total of 20 cc of quarter percent Marcaine plain sterile dressing was applied patient was extubated taken the PACU in stable condition.   DISCHARGE PLANNING:  Antibiotic duration: Preoperative antibiotics  Weightbearing: Weightbearing as tolerated  Pain medication: Prescription for Vicodin  Dressing care/ Wound VAC: Dry dressing change in 3 days  Ambulatory devices: Crutches  Discharge to: Home.  Follow-up: In the office 1 week post operative.

## 2023-05-09 NOTE — Transfer of Care (Signed)
Immediate Anesthesia Transfer of Care Note  Patient: Courtney Vance  Procedure(s) Performed: RIGHT KNEE ARTHROSCOPY AND DEBRIDEMENT (Right: Knee)  Patient Location: PACU  Anesthesia Type:General  Level of Consciousness: awake, alert , and oriented  Airway & Oxygen Therapy: Patient Spontanous Breathing  Post-op Assessment: Report given to RN  Post vital signs: Reviewed and stable  Last Vitals:  Vitals Value Taken Time  BP 114/60 05/09/23 0932  Temp    Pulse 70 05/09/23 0933  Resp 16 05/09/23 0933  SpO2 98 % 05/09/23 0933  Vitals shown include unfiled device data.  Last Pain:  Vitals:   05/09/23 0717  TempSrc:   PainSc: 2          Complications: No notable events documented.
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
## 2023-05-10 ENCOUNTER — Encounter (HOSPITAL_COMMUNITY): Payer: Self-pay | Admitting: Orthopedic Surgery

## 2023-05-19 ENCOUNTER — Encounter: Payer: Self-pay | Admitting: Orthopedic Surgery

## 2023-05-19 ENCOUNTER — Ambulatory Visit: Payer: Medicare Other | Admitting: Orthopedic Surgery

## 2023-05-19 DIAGNOSIS — M23231 Derangement of other medial meniscus due to old tear or injury, right knee: Secondary | ICD-10-CM
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
## 2024-03-25 ENCOUNTER — Encounter: Payer: Self-pay | Admitting: Orthopedic Surgery

## 2024-03-25 ENCOUNTER — Ambulatory Visit (INDEPENDENT_AMBULATORY_CARE_PROVIDER_SITE_OTHER): Admitting: Orthopedic Surgery

## 2024-03-25 DIAGNOSIS — M23231 Derangement of other medial meniscus due to old tear or injury, right knee: Secondary | ICD-10-CM | POA: Diagnosis not present

## 2024-03-25 DIAGNOSIS — M25561 Pain in right knee: Secondary | ICD-10-CM | POA: Diagnosis not present
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
## 2024-05-31 ENCOUNTER — Encounter: Payer: Self-pay | Admitting: Radiology
